# Patient Record
Sex: Male | Born: 1975 | Race: White | Hispanic: No | Marital: Married | Smoking: Never smoker
Health system: Southern US, Community
[De-identification: ages and names within clinical notes are randomized; demographics above are authoritative.]

---

## 1997-07-22 HISTORY — PX: APPENDECTOMY: SHX54

## 2013-11-20 ENCOUNTER — Encounter (HOSPITAL_COMMUNITY): Payer: Self-pay | Admitting: Emergency Medicine

## 2013-11-20 ENCOUNTER — Emergency Department (HOSPITAL_COMMUNITY): Payer: No Typology Code available for payment source

## 2013-11-20 ENCOUNTER — Emergency Department (HOSPITAL_COMMUNITY)
Admission: EM | Admit: 2013-11-20 | Discharge: 2013-11-20 | Disposition: A | Discharge status: Home / Self Care | Payer: No Typology Code available for payment source | Attending: Emergency Medicine | Admitting: Emergency Medicine

## 2013-11-20 DIAGNOSIS — S0083XA Contusion of other part of head, initial encounter: Secondary | ICD-10-CM | POA: Insufficient documentation

## 2013-11-20 DIAGNOSIS — S0003XA Contusion of scalp, initial encounter: Secondary | ICD-10-CM | POA: Insufficient documentation

## 2013-11-20 DIAGNOSIS — F17200 Nicotine dependence, unspecified, uncomplicated: Secondary | ICD-10-CM | POA: Insufficient documentation

## 2013-11-20 DIAGNOSIS — S060XAA Concussion with loss of consciousness status unknown, initial encounter: Secondary | ICD-10-CM | POA: Insufficient documentation

## 2013-11-20 DIAGNOSIS — Z23 Encounter for immunization: Secondary | ICD-10-CM | POA: Insufficient documentation

## 2013-11-20 DIAGNOSIS — S1093XA Contusion of unspecified part of neck, initial encounter: Secondary | ICD-10-CM

## 2013-11-20 DIAGNOSIS — Y92410 Unspecified street and highway as the place of occurrence of the external cause: Secondary | ICD-10-CM | POA: Insufficient documentation

## 2013-11-20 DIAGNOSIS — Y9389 Activity, other specified: Secondary | ICD-10-CM | POA: Insufficient documentation

## 2013-11-20 DIAGNOSIS — IMO0002 Reserved for concepts with insufficient information to code with codable children: Secondary | ICD-10-CM | POA: Insufficient documentation

## 2013-11-20 DIAGNOSIS — S0181XA Laceration without foreign body of other part of head, initial encounter: Secondary | ICD-10-CM

## 2013-11-20 DIAGNOSIS — S060X9A Concussion with loss of consciousness of unspecified duration, initial encounter: Secondary | ICD-10-CM | POA: Insufficient documentation

## 2013-11-20 DIAGNOSIS — S0180XA Unspecified open wound of other part of head, initial encounter: Secondary | ICD-10-CM | POA: Insufficient documentation

## 2013-11-20 MED ORDER — ONDANSETRON HCL 4 MG PO TABS: 4.0000 mg | ORAL_TABLET | Freq: Four times a day (QID) | ORAL | Status: DC

## 2013-11-20 MED ORDER — HYDROMORPHONE HCL PF 1 MG/ML IJ SOLN
1.0000 mg | Freq: Once | INTRAMUSCULAR | Status: AC
  Administered 2013-11-20: 1 mg via INTRAVENOUS
  Filled 2013-11-20: qty 1

## 2013-11-20 MED ORDER — OXYCODONE-ACETAMINOPHEN 5-325 MG PO TABS: 1.0000 | ORAL_TABLET | Freq: Four times a day (QID) | ORAL | Status: DC | PRN

## 2013-11-20 MED ORDER — TETANUS-DIPHTH-ACELL PERTUSSIS 5-2.5-18.5 LF-MCG/0.5 IM SUSP
0.5000 mL | Freq: Once | INTRAMUSCULAR | Status: AC
  Administered 2013-11-20: 0.5 mL via INTRAMUSCULAR
  Filled 2013-11-20: qty 0.5

## 2013-11-20 MED ORDER — ONDANSETRON HCL 4 MG/2ML IJ SOLN
4.0000 mg | Freq: Once | INTRAMUSCULAR | Status: AC
  Administered 2013-11-20: 4 mg via INTRAVENOUS
  Filled 2013-11-20: qty 2

## 2013-11-20 MED ORDER — MORPHINE SULFATE 4 MG/ML IJ SOLN
4.0000 mg | Freq: Once | INTRAMUSCULAR | Status: AC
  Administered 2013-11-20: 4 mg via INTRAVENOUS
  Filled 2013-11-20: qty 1
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
## 2013-11-20 NOTE — ED Notes (Signed)
Dermabond at bedside.   Gingerale and food/crackers placed at bedside.  Pt okay to eat when his pain improves. Pt did take sips of gingerale.

## 2013-11-20 NOTE — ED Notes (Signed)
Pt transported to ct scan

## 2013-11-20 NOTE — ED Notes (Signed)
Family at bedside. 
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
## 2013-11-20 NOTE — ED Notes (Signed)
Dr. Anitra LauthPlunkett at the bedside. Pt removed from the backboard.
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
## 2013-11-20 NOTE — ED Notes (Signed)
Returned from radiology.  Pt reports pain medication not helping much.

## 2014-01-03 ENCOUNTER — Ambulatory Visit: Payer: Self-pay

## 2016-07-11 ENCOUNTER — Ambulatory Visit: Payer: Self-pay | Admitting: Internal Medicine

## 2016-07-17 ENCOUNTER — Ambulatory Visit (INDEPENDENT_AMBULATORY_CARE_PROVIDER_SITE_OTHER): Payer: BLUE CROSS/BLUE SHIELD | Admitting: Internal Medicine

## 2016-07-17 ENCOUNTER — Encounter: Payer: Self-pay | Admitting: Internal Medicine

## 2016-07-17 VITALS — BP 134/98 | HR 82 | Temp 98.8°F | Ht 70.0 in | Wt 218.5 lb

## 2016-07-17 DIAGNOSIS — L2082 Flexural eczema: Secondary | ICD-10-CM

## 2016-07-17 DIAGNOSIS — R3989 Other symptoms and signs involving the genitourinary system: Secondary | ICD-10-CM | POA: Diagnosis not present

## 2016-07-17 DIAGNOSIS — B350 Tinea barbae and tinea capitis: Secondary | ICD-10-CM

## 2016-07-17 DIAGNOSIS — Z0001 Encounter for general adult medical examination with abnormal findings: Secondary | ICD-10-CM | POA: Diagnosis not present

## 2016-07-17 MED ORDER — TRIAMCINOLONE ACETONIDE 0.5 % EX OINT: 1.0000 "application " | TOPICAL_OINTMENT | Freq: Two times a day (BID) | CUTANEOUS | 0 refills | Status: DC

## 2016-07-17 NOTE — Patient Instructions (Signed)

## 2016-07-17 NOTE — Progress Notes (Signed)
HPI  Pt presents to the clinic today to establish care. He has not had a PCP in many years.  Elevated blood pressure: His BP today is 134/98. He has never been told that he has HTN before. He denies headache, blurred vision, dizziness, chest pain or shortness of breath.  Flu: 04/2016 Tetanus: 11/2013 Vision Screening: as needed Dentist: as needed   Diet: He does eat meat. He consumes fruits and veggies some days. He does eat fried foods. He drinks mostly coffee, soda and energy drinks. Exercise: None  No past medical history on file.  No current outpatient prescriptions on file.   No current facility-administered medications for this visit.     No Known Allergies  Family History  Problem Relation Age of Onset  . Hyperlipidemia Maternal Grandmother   . Diabetes Maternal Grandmother   . Heart disease Maternal Grandfather     Social History   Social History  . Marital status: Single    Spouse name: N/A  . Number of children: N/A  . Years of education: N/A   Occupational History  . Not on file.   Social History Main Topics  . Smoking status: Never Smoker  . Smokeless tobacco: Current User    Types: Snuff  . Alcohol use Yes     Comment: social  . Drug use: No  . Sexual activity: Not on file   Other Topics Concern  . Not on file   Social History Narrative  . No narrative on file    ROS:  Constitutional: Denies fever, malaise, fatigue, headache or abrupt weight changes.  HEENT: Denies eye pain, eye redness, ear pain, ringing in the ears, wax buildup, runny nose, nasal congestion, bloody nose, or sore throat. Respiratory: Denies difficulty breathing, shortness of breath, cough or sputum production.   Cardiovascular: Denies chest pain, chest tightness, palpitations or swelling in the hands or feet.  Gastrointestinal: Denies abdominal pain, bloating, constipation, diarrhea or blood in the stool.  GU: Pt reports bladder pressure. Denies frequency, urgency, pain  with urination, blood in urine, odor or discharge. Musculoskeletal: Denies decrease in range of motion, difficulty with gait, muscle pain or joint pain and swelling.  Skin: Pt reports rash above buttocks and rash of left side of scalp. Denies redness, or ulcercations.  Neurological: Denies dizziness, difficulty with memory, difficulty with speech or problems with balance and coordination.  Psych: Denies anxiety, depression, SI/HI.  No other specific complaints in a complete review of systems (except as listed in HPI above).  PE:  BP (!) 134/98   Pulse 82   Temp 98.8 F (37.1 C) (Oral)   Ht 5\' 10"  (1.778 m)   Wt 218 lb 8 oz (99.1 kg)   SpO2 98%   BMI 31.35 kg/m   Wt Readings from Last 3 Encounters:  07/17/16 218 lb 8 oz (99.1 kg)    General: Appears his stated age,  in NAD. Skin: 2 cm round lesion with central clearing on left posterior scalp. Patch of scaly skin noted at top of bilateral buttocks. HEENT: Head: normal shape and size; Eyes: sclera white, no icterus, conjunctiva pink, PERRLA and EOMs intact; Ears: Tm's gray and intact, normal light reflex; Throat/Mouth: Teeth present, mucosa pink and moist, no lesions or ulcerations noted. He has blackening of his lower front gums, tooth appears to be dead. Neck: Neck supple, trachea midline. No masses, lumps or thyromegaly present.  Cardiovascular: Normal rate and rhythm. S1,S2 noted.  No murmur, rubs or gallops noted. No JVD  or BLE edema. No carotid bruits noted. Pulmonary/Chest: Normal effort and positive vesicular breath sounds. No respiratory distress. No wheezes, rales or ronchi noted.  Abdomen: Soft and nontender. Normal bowel sounds. No distention or masses noted. Liver, spleen and kidneys non palpable. Musculoskeletal: Normal range of motion. Strength 5/5 BUE/BLE. No difficulty with gait.  Neurological: Alert and oriented. Cranial nerves II-XII grossly intact. Coordination normal.  Psychiatric: Mood and affect normal. Behavior  is normal. Judgment and thought content normal.    Assessment and Plan:  Preventative Health Maintenance:  Flu and tetanus UTD Encouraged him to consume a balanced diet and exercise regimen Advised him to see an eye doctor and dentist annually Will check CBC, CMET, Lipid and A1C today  Eczema of buttock:  Advised him to avoid hot showers or bath Encouraged him to use [facility] or Eucerin BID eRx for Kenalog cream to affected area BID  Tinea Capitis:  CMET today If liver enzymes normal, will treat with Terbinafine x 6 weeks  Bladder pressure:  Due to drinking before bed and not voiding until the next morning Advised him to stop any fluids after 7 pm  RTC in 1 year, sooner if needed Nicki ReaperBAITY, Mayanna Garlitz, NP

## 2016-07-18 ENCOUNTER — Other Ambulatory Visit: Payer: Self-pay | Admitting: Internal Medicine

## 2016-07-18 LAB — COMPREHENSIVE METABOLIC PANEL
ALT: 33 U/L (ref 0–53)
AST: 23 U/L (ref 0–37)
Albumin: 5.1 g/dL (ref 3.5–5.2)
Alkaline Phosphatase: 54 U/L (ref 39–117)
BUN: 12 mg/dL (ref 6–23)
CALCIUM: 10.1 mg/dL (ref 8.4–10.5)
CHLORIDE: 100 meq/L (ref 96–112)
CO2: 31 meq/L (ref 19–32)
CREATININE: 1 mg/dL (ref 0.40–1.50)
GFR: 87.83 mL/min (ref >60.00)
Glucose, Bld: 92 mg/dL (ref 70–99)
Potassium: 4.4 mEq/L (ref 3.5–5.1)
Sodium: 138 mEq/L (ref 135–145)
Total Bilirubin: 0.6 mg/dL (ref 0.2–1.2)
Total Protein: 8.1 g/dL (ref 6.0–8.3)

## 2016-07-18 LAB — CBC
HCT: 46 % (ref 39.0–52.0)
Hemoglobin: 15.8 g/dL (ref 13.0–17.0)
MCHC: 34.4 g/dL (ref 30.0–36.0)
MCV: 89.2 fl (ref 78.0–100.0)
PLATELETS: 264 10*3/uL (ref 150.0–400.0)
RBC: 5.16 Mil/uL (ref 4.22–5.81)
RDW: 13.1 % (ref 11.5–15.5)
WBC: 6 10*3/uL (ref 4.0–10.5)

## 2016-07-18 LAB — LIPID PANEL
Cholesterol: 223 mg/dL — ABNORMAL HIGH (ref 0–200)
HDL: 53.1 mg/dL (ref >39.00)
LDL CALC: 148 mg/dL — AB (ref 0–99)
NonHDL: 169.61
TRIGLYCERIDES: 110 mg/dL (ref 0.0–149.0)
Total CHOL/HDL Ratio: 4
VLDL: 22 mg/dL (ref 0.0–40.0)

## 2016-07-18 LAB — HEMOGLOBIN A1C: Hgb A1c MFr Bld: 4.7 % (ref 4.6–6.5)

## 2016-07-18 MED ORDER — TERBINAFINE HCL 250 MG PO TABS: 250.0000 mg | ORAL_TABLET | Freq: Every day | ORAL | 1 refills | Status: DC

## 2017-06-07 ENCOUNTER — Encounter (HOSPITAL_COMMUNITY): Payer: Self-pay | Admitting: Emergency Medicine

## 2017-06-07 ENCOUNTER — Emergency Department (HOSPITAL_COMMUNITY)
Admission: EM | Admit: 2017-06-07 | Discharge: 2017-06-07 | Disposition: A | Discharge status: Home / Self Care | Payer: BLUE CROSS/BLUE SHIELD | Attending: Emergency Medicine | Admitting: Emergency Medicine

## 2017-06-07 DIAGNOSIS — R05 Cough: Secondary | ICD-10-CM | POA: Insufficient documentation

## 2017-06-07 DIAGNOSIS — F17220 Nicotine dependence, chewing tobacco, uncomplicated: Secondary | ICD-10-CM | POA: Insufficient documentation

## 2017-06-07 DIAGNOSIS — R112 Nausea with vomiting, unspecified: Secondary | ICD-10-CM | POA: Insufficient documentation

## 2017-06-07 DIAGNOSIS — Z79899 Other long term (current) drug therapy: Secondary | ICD-10-CM | POA: Diagnosis not present

## 2017-06-07 DIAGNOSIS — J029 Acute pharyngitis, unspecified: Secondary | ICD-10-CM | POA: Diagnosis not present

## 2017-06-07 LAB — RAPID STREP SCREEN (MED CTR MEBANE ONLY): Streptococcus, Group A Screen (Direct): NEGATIVE

## 2017-06-07 MED ORDER — LIDOCAINE VISCOUS 2 % MT SOLN: 15.0000 mL | OROMUCOSAL | 0 refills | Status: DC | PRN

## 2017-06-07 MED ORDER — ONDANSETRON 4 MG PO TBDP
4.0000 mg | ORAL_TABLET | Freq: Once | ORAL | Status: AC
  Administered 2017-06-07: 4 mg via ORAL
  Filled 2017-06-07: qty 1

## 2017-06-07 MED ORDER — KETOROLAC TROMETHAMINE 60 MG/2ML IM SOLN
60.0000 mg | Freq: Once | INTRAMUSCULAR | Status: AC
  Administered 2017-06-07: 60 mg via INTRAMUSCULAR
  Filled 2017-06-07: qty 2

## 2017-06-07 MED ORDER — ONDANSETRON 4 MG PO TBDP: 4.0000 mg | ORAL_TABLET | Freq: Three times a day (TID) | ORAL | 0 refills | Status: DC | PRN

## 2017-06-07 MED ORDER — DEXAMETHASONE SODIUM PHOSPHATE 10 MG/ML IJ SOLN
10.0000 mg | Freq: Once | INTRAMUSCULAR | Status: AC
  Administered 2017-06-07: 10 mg via INTRAMUSCULAR
  Filled 2017-06-07: qty 1

## 2017-06-07 MED ORDER — LIDOCAINE VISCOUS 2 % MT SOLN
15.0000 mL | Freq: Once | OROMUCOSAL | Status: AC
  Administered 2017-06-07: 15 mL via OROMUCOSAL
  Filled 2017-06-07: qty 15

## 2017-06-07 NOTE — ED Provider Notes (Signed)
[facility] EMERGENCY DEPARTMENT Provider Note   CSN: [id number] Arrival date & time: 06/07/17  1109     History   Chief Complaint Chief Complaint  Patient presents with  . Sore Throat  . Facial Pain    HPI Ralph White is a 41 y.o. male who presents to the emergency department with a chief complaint of nasal congestion and a sore throat that began 2 days ago.  He has treated his symptoms with ibuprofen at home without relief.  He describes the pain as "razors in my throat."  He reports that he has been taking shallow breaths worsened overnight because he gets anxious about the air going in and out causing worsening pain.  He reports nausea and one episode of nonbloody nonbilious emesis this morning after he drank liquids to try and take ibuprofen and a nonproductive cough.  He denies fever, chills, headache, shortness of breath, muffled voice, chest pain, neck pain or swelling, trismus, or drooling.   No pertinent past medical history.  The history is provided by the patient. No language interpreter was used.  Sore Throat  This is a new problem. The current episode started 2 days ago. The problem occurs constantly. The problem has been gradually worsening. Pertinent negatives include no chest pain, no abdominal pain, no headaches and no shortness of breath. The symptoms are aggravated by swallowing, drinking and eating. Nothing relieves the symptoms. Treatments tried: NSAIDs. The treatment provided no relief.    History reviewed. No pertinent past medical history.  There are no active problems to display for this patient.   Past Surgical History:  Procedure Laterality Date  . APPENDECTOMY  1999       Home Medications    Prior to Admission medications   Medication Sig Start Date End Date Taking? Authorizing Provider  lidocaine (XYLOCAINE) 2 % solution Use as directed 15 mLs every 3 (three) hours as needed in the mouth or throat for mouth pain. 06/07/17    Dalayah Deahl A, PA-C  ondansetron (ZOFRAN ODT) 4 MG disintegrating tablet Take 1 tablet (4 mg total) every 8 (eight) hours as needed by mouth for nausea or vomiting. 06/07/17   Johannes Everage A, PA-C  terbinafine (LAMISIL) 250 MG tablet Take 1 tablet (250 mg total) by mouth daily. 07/18/16   Lorre Munroe, NP  triamcinolone ointment (KENALOG) 0.5 % Apply 1 application topically 2 (two) times daily. 07/17/16   Lorre Munroe, NP    Family History Family History  Problem Relation Age of Onset  . Hyperlipidemia Maternal Grandmother   . Diabetes Maternal Grandmother   . Heart disease Maternal Grandfather     Social History Social History   Tobacco Use  . Smoking status: Never Smoker  . Smokeless tobacco: Current User    Types: Snuff  Substance Use Topics  . Alcohol use: Yes    Comment: social  . Drug use: No     Allergies   Patient has no known allergies.   Review of Systems Review of Systems  Constitutional: Negative for activity change, chills and fever.  HENT: Positive for congestion, sore throat and trouble swallowing. Negative for ear pain, rhinorrhea, sinus pain, sneezing and voice change.   Eyes: Negative for visual disturbance.  Respiratory: Positive for cough. Negative for shortness of breath, wheezing and stridor.   Cardiovascular: Negative for chest pain and palpitations.  Gastrointestinal: Positive for nausea and vomiting. Negative for abdominal pain and diarrhea.  Musculoskeletal: Negative for back pain and  myalgias.  Skin: Negative for rash.  Neurological: Negative for headaches.     Physical Exam Updated Vital Signs BP 130/87 (BP Location: Right Arm)   Pulse 66   Temp 98.4 F (36.9 C) (Oral)   Resp 19   Ht 5' 10.5" (1.791 m)   Wt 97.5 kg (215 lb)   SpO2 97%   BMI 30.41 kg/m   Physical Exam  Constitutional: He appears well-developed.  Anxious appearing  HENT:  Head: Normocephalic and atraumatic.  Right Ear: Tympanic membrane and ear canal  normal.  Left Ear: Tympanic membrane and ear canal normal.  Mouth/Throat: Uvula is midline and mucous membranes are normal. No oral lesions. No trismus in the jaw. No uvula swelling or dental caries. Posterior oropharyngeal edema and posterior oropharyngeal erythema present. No tonsillar abscesses. Tonsils are 1+ on the right. Tonsils are 2+ on the left. No tonsillar exudate.  Eyes: Conjunctivae are normal.  Neck: Normal range of motion. Neck supple. No thyromegaly present.  Cardiovascular: Normal rate, regular rhythm and normal heart sounds. Exam reveals no gallop and no friction rub.  No murmur heard. Pulmonary/Chest: Effort normal.  Abdominal: Soft. He exhibits no distension.  Lymphadenopathy:    He has cervical adenopathy.  Neurological: He is alert.  Skin: Skin is warm and dry.  Psychiatric: His behavior is normal.  Nursing note and vitals reviewed.    ED Treatments / Results  Labs (all labs ordered are listed, but only abnormal results are displayed) Labs Reviewed  RAPID STREP SCREEN (NOT AT [facility])    EKG  EKG Interpretation None       Radiology No results found.  Procedures Procedures (including critical care time)  Medications Ordered in ED Medications  lidocaine (XYLOCAINE) 2 % viscous mouth solution 15 mL (15 mLs Mouth/Throat Given 06/07/17 1448)  ondansetron (ZOFRAN-ODT) disintegrating tablet 4 mg (4 mg Oral Given 06/07/17 1448)  ketorolac (TORADOL) injection 60 mg (60 mg Intramuscular Given 06/07/17 1451)  dexamethasone (DECADRON) injection 10 mg (10 mg Intramuscular Given 06/07/17 1451)     Initial Impression / Assessment and Plan / ED Course  I have reviewed the triage vital signs and the nursing notes.  Pertinent labs & imaging results that were available during my care of the patient were reviewed by me and considered in my medical decision making (see chart for details).  Clinical Course as of Jun 07 1530  Sat Jun 07, 2017  1523 On reevaluation,  no tachypnea.  The patient is sitting comfortably upright in the chair.  No acute distress.  He reports that his pain has significantly improved.  Will fluid challenge the patient with orange juice and plan for discharge.  [MM]    Clinical Course User Index [MM] Boluwatife Mutchler A, PA-C    41 y.o. patient with 2 days of acute pharyngitis.  On initial examination, the patient appears anxious and is taking shallow breaths because he states "air feels like razor blades in my throat."  On reevaluation, after viscous lidocaine, the patient is sitting comfortably; no tachypnea; no acute distress.  Afebrile with anterior cervical lymphadenopathy. No tonsillar exudate; mild left-sided tonsillar edema.  Doubt peritonsillar abscess. No asymmetry of the uvula or soft palate. No stridor, trismus, or "hot potato" voice. The patient appears non-toxic. NAD. VSS. Rapid strep negative.  Presentation non concerning for Ludwig's angina, Uvulitis, epiglottitis, peritonsillar abscess, or retropharyngeal abscess. Specific return precautions discussed. Pt able to drink water in ED without difficulty with intact airway. Will d/c the patient with  symptomatic treatment. Recommended PCP follow up.    Final Clinical Impressions(s) / ED Diagnoses   Final diagnoses:  Viral pharyngitis  Nausea and vomiting, intractability of vomiting not specified, unspecified vomiting type    ED Discharge Orders        Ordered    lidocaine (XYLOCAINE) 2 % solution  Every  3 hours PRN     06/07/17 1525    ondansetron (ZOFRAN ODT) 4 MG disintegrating tablet  Every 8 hours PRN     06/07/17 1526       Lavonna Lampron A, PA-C 06/07/17 1531    Gerhard MunchLockwood, Robert, MD 06/07/17 [phone]

## 2017-06-07 NOTE — ED Triage Notes (Signed)
Pt. Stated, I started having Thursday with sinus drainage and then my throat started hurting really bad. Im really hoarse.n  Pt's  left side of throat/tonsils is red.

## 2017-06-07 NOTE — ED Triage Notes (Signed)
Reported to PA Pt is requesting pain meds.

## 2017-06-07 NOTE — ED Notes (Signed)
Declined W/C at D/C and was escorted to lobby by RN. 

## 2017-06-07 NOTE — Discharge Instructions (Signed)
You can swallow 15 mL of viscous lidocaine once every 3 hours as needed for throat pain.  Please do not use this medication more than once every 3 hours.  You can take 1000 mg of Tylenol once every 8 hours or 800 mg of ibuprofen with food to help with pain and inflammation.  Drinking hot and cold liquids should cause less pain with swallowing than room temperature food or liquids.  If you develop new or worsening symptoms, including the inability to fully open your mouth, muffled voice, drooling, or feeling as if your throat is closing, please return to the emergency department for reevaluation.

## 2017-06-07 NOTE — ED Triage Notes (Signed)
Pt drinking apple juice without difficulty.

## 2017-06-09 ENCOUNTER — Telehealth: Payer: Self-pay | Admitting: Internal Medicine

## 2017-06-09 NOTE — Telephone Encounter (Signed)
Spoke to pt who states his symptoms have not improved from ED visit. Pt is wanting to know if there are any suggestions Rene KocherRegina has, so that he can return to work. pls advise

## 2017-06-09 NOTE — Telephone Encounter (Signed)
Patient advised.

## 2017-06-09 NOTE — Telephone Encounter (Signed)
Copied from CRM [id number]. Topic: Appointment Scheduling - Scheduling Inquiry for Clinic >> Jun 09, 2017  9:47 AM Ralph White, Ralph White wrote: Reason for CRM: pt went to hospital on Sat and is progressively getting worse.  Pt declined another practice, stating it is too far and Dr Sampson SiBaity is his PCP. All OTC not working.  Pt was given gargle med at the hospital. Also steroid injection. But not better. Requesting appt today  Please call wife. Pt has no voice, she has him sitting in hot shower right now.  Attempted to call patient- mailbox full- could not leave message.

## 2017-06-09 NOTE — Telephone Encounter (Signed)
Its hard for me to give advice, when I haven't seen him for this. The steroid injection should help. Otherwise, Ibuprofen, antihistamine and salt water gargles.

## 2017-06-10 LAB — CULTURE, GROUP A STREP (THRC)

## 2017-07-18 ENCOUNTER — Encounter: Payer: Self-pay | Admitting: Internal Medicine

## 2017-07-18 ENCOUNTER — Ambulatory Visit (INDEPENDENT_AMBULATORY_CARE_PROVIDER_SITE_OTHER): Payer: BLUE CROSS/BLUE SHIELD | Admitting: Internal Medicine

## 2017-07-18 VITALS — BP 128/82 | HR 91 | Temp 98.3°F | Ht 70.25 in | Wt 223.0 lb

## 2017-07-18 DIAGNOSIS — L989 Disorder of the skin and subcutaneous tissue, unspecified: Secondary | ICD-10-CM

## 2017-07-18 DIAGNOSIS — Z13220 Encounter for screening for lipoid disorders: Secondary | ICD-10-CM

## 2017-07-18 DIAGNOSIS — Z0001 Encounter for general adult medical examination with abnormal findings: Secondary | ICD-10-CM

## 2017-07-18 LAB — COMPREHENSIVE METABOLIC PANEL
ALBUMIN: 5 g/dL (ref 3.5–5.2)
ALK PHOS: 51 U/L (ref 39–117)
ALT: 48 U/L (ref 0–53)
AST: 31 U/L (ref 0–37)
BILIRUBIN TOTAL: 1 mg/dL (ref 0.2–1.2)
BUN: 9 mg/dL (ref 6–23)
CALCIUM: 9.8 mg/dL (ref 8.4–10.5)
CO2: 30 mEq/L (ref 19–32)
Chloride: 102 mEq/L (ref 96–112)
Creatinine, Ser: 1.01 mg/dL (ref 0.40–1.50)
GFR: 86.4 mL/min (ref >60.00)
Glucose, Bld: 97 mg/dL (ref 70–99)
Potassium: 4.2 mEq/L (ref 3.5–5.1)
Sodium: 139 mEq/L (ref 135–145)
TOTAL PROTEIN: 7.9 g/dL (ref 6.0–8.3)

## 2017-07-18 LAB — CBC
HCT: 45.2 % (ref 39.0–52.0)
HEMOGLOBIN: 15.6 g/dL (ref 13.0–17.0)
MCHC: 34.6 g/dL (ref 30.0–36.0)
MCV: 90.8 fl (ref 78.0–100.0)
Platelets: 267 10*3/uL (ref 150.0–400.0)
RBC: 4.98 Mil/uL (ref 4.22–5.81)
RDW: 13.4 % (ref 11.5–15.5)
WBC: 5.4 10*3/uL (ref 4.0–10.5)

## 2017-07-18 LAB — LIPID PANEL
CHOLESTEROL: 224 mg/dL — AB (ref 0–200)
HDL: 48.8 mg/dL (ref >39.00)
LDL Cholesterol: 155 mg/dL — ABNORMAL HIGH (ref 0–99)
NonHDL: 175.11
TRIGLYCERIDES: 103 mg/dL (ref 0.0–149.0)
Total CHOL/HDL Ratio: 5
VLDL: 20.6 mg/dL (ref 0.0–40.0)

## 2017-07-18 NOTE — Progress Notes (Signed)
Subjective:    Patient ID: Ralph White, male    DOB: August 05, 1975, 41 y.o.   MRN: [id number]  HPI  Pt presents to the clinic today for his annual exam. Pt reports persistent skin lesion of scalp. He was treated for this 1 year ago with Terbinafine for a tinea capitis. He reports no improvement since that time. The lesion has not gotten any bigger is size. He denies any drainage from the area. He has not tried anything OTC for his symptoms.  Flu: 04/2017 Tetanus: 11/2013 Vision Screening: as needed Dentist: as needed  Diet: He does eat meat. He consumes some fruits and veggies. He does eat fried foods. He drinks mostly coffee, Monster energy drinks and Coke. Exercise: None  Review of Systems  No past medical history on file.  Current Outpatient Medications  Medication Sig Dispense Refill  . lidocaine (XYLOCAINE) 2 % solution Use as directed 15 mLs every 3 (three) hours as needed in the mouth or throat for mouth pain. 100 mL 0  . ondansetron (ZOFRAN ODT) 4 MG disintegrating tablet Take 1 tablet (4 mg total) every 8 (eight) hours as needed by mouth for nausea or vomiting. 20 tablet 0  . terbinafine (LAMISIL) 250 MG tablet Take 1 tablet (250 mg total) by mouth daily. 30 tablet 1  . triamcinolone ointment (KENALOG) 0.5 % Apply 1 application topically 2 (two) times daily. 30 g 0   No current facility-administered medications for this visit.     No Known Allergies  Family History  Problem Relation Age of Onset  . Hyperlipidemia Maternal Grandmother   . Diabetes Maternal Grandmother   . Heart disease Maternal Grandfather     Social History   Socioeconomic History  . Marital status: Single    Spouse name: Not on file  . Number of children: Not on file  . Years of education: Not on file  . Highest education level: Not on file  Social Needs  . Financial resource strain: Not on file  . Food insecurity - worry: Not on file  . Food insecurity - inability: Not on file  .  Transportation needs - medical: Not on file  . Transportation needs - non-medical: Not on file  Occupational History  . Not on file  Tobacco Use  . Smoking status: Never Smoker  . Smokeless tobacco: Current User    Types: Snuff  Substance and Sexual Activity  . Alcohol use: Yes    Comment: social  . Drug use: No  . Sexual activity: Yes  Other Topics Concern  . Not on file  Social History Narrative  . Not on file     Constitutional: Denies fever, malaise, fatigue, headache or abrupt weight changes.  HEENT: Denies eye pain, eye redness, ear pain, ringing in the ears, wax buildup, runny nose, nasal congestion, bloody nose, or sore throat. Respiratory: Denies difficulty breathing, shortness of breath, cough or sputum production.   Cardiovascular: Denies chest pain, chest tightness, palpitations or swelling in the hands or feet.  Gastrointestinal: Denies abdominal pain, bloating, constipation, diarrhea or blood in the stool.  GU: Pt reports weak urine stream. Denies urgency, frequency, pain with urination, burning sensation, blood in urine, odor or discharge. Musculoskeletal: Denies decrease in range of motion, difficulty with gait, muscle pain or joint pain and swelling.  Skin: Pt reports skin lesion of scalp. Denies  ulcercations.  Neurological: Denies dizziness, difficulty with memory, difficulty with speech or problems with balance and coordination.  Psych: Denies anxiety, depression,  SI/HI.  No other specific complaints in a complete review of systems (except as listed in HPI above).     Objective:   Physical Exam  BP 128/82   Pulse 91   Temp 98.3 F (36.8 C) (Oral)   Ht 5' 10.25" (1.784 m)   Wt 223 lb (101.2 kg)   SpO2 98%   BMI 31.77 kg/m  Wt Readings from Last 3 Encounters:  07/18/17 223 lb (101.2 kg)  06/07/17 215 lb (97.5 kg)  07/17/16 218 lb 8 oz (99.1 kg)    General: Appears his stated age, obese in NAD. Skin: Warm, dry and intact. 1.5 cm erythematous,  scaly, round lesion noted of left occipital region. HEENT: Head: normal shape and size; Eyes: sclera white, no icterus, conjunctiva pink, PERRLA and EOMs intact; Ears: Tm's gray and intact, normal light reflex; Throat/Mouth: Teeth present, mucosa pink and moist, no exudate, lesions or ulcerations noted.  Neck:  Neck supple, trachea midline. No masses, lumps or thyromegaly present.  Cardiovascular: Normal rate and rhythm. S1,S2 noted.  No murmur, rubs or gallops noted. No JVD or BLE edema.  Pulmonary/Chest: Normal effort and positive vesicular breath sounds. No respiratory distress. No wheezes, rales or ronchi noted.  Abdomen: Soft and nontender. Normal bowel sounds. No distention or masses noted. Liver, spleen and kidneys non palpable. Musculoskeletal: Strength 5/5 BUE/BLE. No difficulty with gait.  Neurological: Alert and oriented. Cranial nerves II-XII grossly intact. Coordination normal.  Psychiatric: Mood and affect normal. Behavior is normal. Judgment and thought content normal.    BMET    Component Value Date/Time   NA 138 07/17/2016 1540   K 4.4 07/17/2016 1540   CL 100 07/17/2016 1540   CO2 31 07/17/2016 1540   GLUCOSE 92 07/17/2016 1540   BUN 12 07/17/2016 1540   CREATININE 1.00 07/17/2016 1540   CALCIUM 10.1 07/17/2016 1540    Lipid Panel     Component Value Date/Time   CHOL 223 (H) 07/17/2016 1540   TRIG 110.0 07/17/2016 1540   HDL 53.10 07/17/2016 1540   CHOLHDL 4 07/17/2016 1540   VLDL 22.0 07/17/2016 1540   LDLCALC 148 (H) 07/17/2016 1540    CBC    Component Value Date/Time   WBC 6.0 07/17/2016 1540   RBC 5.16 07/17/2016 1540   HGB 15.8 07/17/2016 1540   HCT 46.0 07/17/2016 1540   PLT 264.0 07/17/2016 1540   MCV 89.2 07/17/2016 1540   MCHC 34.4 07/17/2016 1540   RDW 13.1 07/17/2016 1540    Hgb A1C Lab Results  Component Value Date   HGBA1C 4.7 07/17/2016            Assessment & Plan:   Preventative Health Maintenance:  Flu and tetanus  UTD Encouraged him to consume a balanced diet and exercise regimen Advised him to see an eye doctor and dentist annually Will check CBC, CMET and Lipid profile today  Skin Lesion of Scalp:  Failed antifungal treatment for tinea capitis Will refer to dermatology for further evaluation  RTC in 1 year, sooner if needed Nicki ReaperBAITY, Amier Hoyt, NP

## 2017-07-18 NOTE — Patient Instructions (Signed)

## 2017-07-25 MED ORDER — ATORVASTATIN CALCIUM 10 MG PO TABS: 10.0000 mg | ORAL_TABLET | Freq: Every day | ORAL | 2 refills | Status: DC

## 2017-07-25 NOTE — Addendum Note (Signed)
Addended by: Desmond DikeKNIGHT, Bryanah Sidell H on: 07/25/2017 10:26 AM   Modules accepted: Orders

## 2017-07-25 NOTE — Addendum Note (Signed)
Addended by: Desmond DikeKNIGHT, Carrick Rijos H on: 07/25/2017 10:25 AM   Modules accepted: Orders

## 2017-08-26 ENCOUNTER — Other Ambulatory Visit: Payer: Self-pay

## 2017-08-26 ENCOUNTER — Encounter: Payer: Self-pay | Admitting: Emergency Medicine

## 2017-08-26 ENCOUNTER — Emergency Department
Admission: EM | Admit: 2017-08-26 | Discharge: 2017-08-26 | Disposition: A | Discharge status: Home / Self Care | Payer: BLUE CROSS/BLUE SHIELD | Attending: Emergency Medicine | Admitting: Emergency Medicine

## 2017-08-26 ENCOUNTER — Emergency Department: Payer: BLUE CROSS/BLUE SHIELD

## 2017-08-26 DIAGNOSIS — Z79899 Other long term (current) drug therapy: Secondary | ICD-10-CM | POA: Diagnosis not present

## 2017-08-26 DIAGNOSIS — N50812 Left testicular pain: Secondary | ICD-10-CM

## 2017-08-26 DIAGNOSIS — Z9049 Acquired absence of other specified parts of digestive tract: Secondary | ICD-10-CM | POA: Insufficient documentation

## 2017-08-26 DIAGNOSIS — K4090 Unilateral inguinal hernia, without obstruction or gangrene, not specified as recurrent: Secondary | ICD-10-CM | POA: Insufficient documentation

## 2017-08-26 DIAGNOSIS — N50819 Testicular pain, unspecified: Secondary | ICD-10-CM | POA: Diagnosis present

## 2017-08-26 LAB — URINALYSIS, COMPLETE (UACMP) WITH MICROSCOPIC
BACTERIA UA: NONE SEEN
BILIRUBIN URINE: NEGATIVE
Glucose, UA: NEGATIVE mg/dL
Hgb urine dipstick: NEGATIVE
Ketones, ur: NEGATIVE mg/dL
LEUKOCYTES UA: NEGATIVE
NITRITE: NEGATIVE
Protein, ur: NEGATIVE mg/dL
SQUAMOUS EPITHELIAL / LPF: NONE SEEN
Specific Gravity, Urine: 1.024 (ref 1.005–1.030)
pH: 5 (ref 5.0–8.0)

## 2017-08-26 MED ORDER — KETOROLAC TROMETHAMINE 30 MG/ML IJ SOLN
30.0000 mg | Freq: Once | INTRAMUSCULAR | Status: AC
  Administered 2017-08-26: 30 mg via INTRAMUSCULAR
  Filled 2017-08-26: qty 1

## 2017-08-26 MED ORDER — NAPROXEN 500 MG PO TABS: 500.0000 mg | ORAL_TABLET | Freq: Two times a day (BID) | ORAL | 2 refills | Status: DC

## 2017-08-26 NOTE — ED Provider Notes (Signed)
[facility] Emergency Department Provider Note   ____________________________________________    I have reviewed the triage vital signs and the nursing notes.   HISTORY  Chief Complaint Testicle Pain     HPI Ralph White is a 42 y.o. male who presents with complaints of left testicle pain.  Pain started yesterday he describes as a pinching pain occasionally gets worse sometimes it completely goes away.  Has never had this before.  Denies dysuria.  No penile discharge.  No history of kidney stones.  No flank pain.  No nausea or vomiting or fevers or chills.  No erythema or testicular swelling   History reviewed. No pertinent past medical history.  There are no active problems to display for this patient.   Past Surgical History:  Procedure Laterality Date  . APPENDECTOMY  1999    Prior to Admission medications   Medication Sig Start Date End Date Taking? Authorizing Provider  atorvastatin (LIPITOR) 10 MG tablet Take 1 tablet (10 mg total) by mouth daily. 07/25/17  Yes Lorre MunroeBaity, Regina W, NP  naproxen (NAPROSYN) 500 MG tablet Take 1 tablet (500 mg total) by mouth 2 (two) times daily with a meal. 08/26/17   Jene EveryKinner, Tifani Dack, MD     Allergies Patient has no known allergies.  Family History  Problem Relation Age of Onset  . Hyperlipidemia Maternal Grandmother   . Diabetes Maternal Grandmother   . Heart disease Maternal Grandfather     Social History Social History   Tobacco Use  . Smoking status: Never Smoker  . Smokeless tobacco: Current User    Types: Snuff  Substance Use Topics  . Alcohol use: Yes    Comment: social  . Drug use: No    Review of Systems  Constitutional: No fever/chills Eyes: No visual changes.  ENT: No sore throat. Cardiovascular: Denies chest pain. Respiratory: Denies shortness of breath. Gastrointestinal: No abdominal pain.  No nausea, no vomiting.   Genitourinary: As above Musculoskeletal: Negative for back  pain. Skin: Negative for rash. Neurological: Negative for headaches    ____________________________________________   PHYSICAL EXAM:  VITAL SIGNS: ED Triage Vitals  Enc Vitals Group     BP 08/26/17 0632 127/88     Pulse Rate 08/26/17 0632 88     Resp 08/26/17 0632 18     Temp 08/26/17 0632 98.8 F (37.1 C)     Temp Source 08/26/17 0632 Oral     SpO2 08/26/17 0632 97 %     Weight 08/26/17 0633 99.8 kg (220 lb)     Height 08/26/17 0633 1.778 m (5\' 10" )     Head Circumference --      Peak Flow --      Pain Score 08/26/17 0633 3     Pain Loc --      Pain Edu? --      Excl. in GC? --     Constitutional: Alert and oriented. No acute distress. Pleasant and interactive Eyes: Conjunctivae are normal.   Nose: No congestion/rhinnorhea. Mouth/Throat: Mucous membranes are moist.    Cardiovascular: Normal rate, regular rhythm.   Good peripheral circulation. Respiratory: Normal respiratory effort.  No retractions. Gastrointestinal: Soft and nontender. No distention.  No CVA tenderness. Genitourinary: Normal scrotum, no erythema, no swelling, no penile discharge, no rash, no hernias felt Musculoskeletal:   Warm and well perfused Neurologic:  Normal speech and language. No gross focal neurologic deficits are appreciated.  Skin:  Skin is warm, dry and intact. No rash  noted. Psychiatric: Mood and affect are normal. Speech and behavior are normal.  ____________________________________________   LABS (all labs ordered are listed, but only abnormal results are displayed)  Labs Reviewed  URINALYSIS, COMPLETE (UACMP) WITH MICROSCOPIC - Abnormal; Notable for the following components:      Result Value   Color, Urine YELLOW (*)    APPearance HAZY (*)    All other components within normal limits   ____________________________________________  EKG  None ____________________________________________  RADIOLOGY  Ultrasound scrotum unremarkable CT renal stone study shows small  inguinal hernias ____________________________________________   PROCEDURES  Procedure(s) performed: No  Procedures   Critical Care performed: No ____________________________________________   INITIAL IMPRESSION / ASSESSMENT AND PLAN / ED COURSE  Pertinent labs & imaging results that were available during my care of the patient were reviewed by me and considered in my medical decision making (see chart for details).  Patient well-appearing in no acute distress.  Exam is quite reassuring.  Ultrasound scrotum normal.  CT scan ordered for possible kidney stone causing radiated pain in the testicle was also unremarkable, shows very small inguinal hernias.  I will have the patient follow-up with urology, he has had significant relief from Toradol so we will discharge with Naprosyn    ____________________________________________   FINAL CLINICAL IMPRESSION(S) / ED DIAGNOSES  Final diagnoses:  Testicular pain, left        Note:  This document was prepared using Dragon voice recognition software and may include unintentional dictation errors.    Jene Every, MD 08/26/17 1356

## 2017-08-26 NOTE — ED Triage Notes (Addendum)
Patient ambulatory to triage with steady gait, without difficulty or distress noted; pt reports since yesterday having pain to left testicle; denies any swelling; denies any other accomp symptoms

## 2017-08-26 NOTE — ED Notes (Signed)
Signature pad not working in pt room.  Pt verbalized understanding of d/c instructions and use of RX.

## 2017-08-26 NOTE — ED Notes (Addendum)
PT c/o LFT testicle pain since yesterday, denies swelling or redness, no injury known. MD at bedside

## 2017-09-16 ENCOUNTER — Ambulatory Visit (INDEPENDENT_AMBULATORY_CARE_PROVIDER_SITE_OTHER): Payer: BLUE CROSS/BLUE SHIELD | Admitting: Urology

## 2017-09-16 ENCOUNTER — Encounter: Payer: Self-pay | Admitting: Urology

## 2017-09-16 VITALS — BP 133/81 | HR 87 | Ht 70.0 in | Wt 215.0 lb

## 2017-09-16 DIAGNOSIS — Z125 Encounter for screening for malignant neoplasm of prostate: Secondary | ICD-10-CM | POA: Diagnosis not present

## 2017-09-16 DIAGNOSIS — R39198 Other difficulties with micturition: Secondary | ICD-10-CM | POA: Diagnosis not present

## 2017-09-16 DIAGNOSIS — N50819 Testicular pain, unspecified: Secondary | ICD-10-CM

## 2017-09-16 NOTE — Progress Notes (Signed)
09/16/2017 9:08 AM   Ralph HanlonJoshua White 28-Aug-1975 [id number]  Referring provider: Lorre MunroeBaity, Regina W, NP [address] [place], [place] [id number]  Chief Complaint  Patient presents with  . Testicle Pain    New Patient    HPI: 42 year old male who presents today for follow-up from an emergency room visit on 08/26/2017 with acute left testicular pain.    He reports that on the day prior of his ER visit, he began experiencing dull aching left testicular pain which worsened throughout the day.  He also had some pain in his left belt line.  The pain continued throughout the evening.  In the early morning hours, he woke up his wife with severe pain which ultimately led to an ER visit.  In the ER, he underwent further evaluation with us renal ultrasound which was fairly unremarkable other than incidental 4 mm right epididymal cyst.  He also had a noncontrast CT scan to evaluate for stones which showed no stones or hydronephrosis.  Incidental bilateral small fat-containing inguinal hernias appreciated.  Within several hours of discharge from the ER, his pain completely resolved.  He did receive NSAIDs in the ER and NSAIDs upon discharge.  He has not had any recurrence of his pain.  Denies any previous history of testicular or flank pain.  He denies personal history of kidney stones.  He does have some baseline urinary issues including weaker urinary stream which is progressive over the past few years.  He also reports in the morning time when he asked to urinate, he has discomfort in his abdomen and his testicles.  This resolved with voiding.  Denies any dysuria, gross hematuria, UTIs, or any other voiding symptoms.  No family history of prostate cancer.   PMH: No past medical history on file.  Surgical History: Past Surgical History:  Procedure Laterality Date  . APPENDECTOMY  1999    Home Medications:  Allergies as of 09/16/2017   No Known Allergies     Medication List    as of  09/16/2017  9:08 AM   You have not been prescribed any medications.     Allergies: No Known Allergies  Family History: Family History  Problem Relation Age of Onset  . Hyperlipidemia Maternal Grandmother   . Diabetes Maternal Grandmother   . Heart disease Maternal Grandfather     Social History:  reports that  has never smoked. His smokeless tobacco use includes snuff. He reports that he drinks alcohol. He reports that he does not use drugs.  ROS: UROLOGY Frequent Urination?: No Hard to postpone urination?: No Burning/pain with urination?: No Get up at night to urinate?: Yes Leakage of urine?: No Urine stream starts and stops?: No Trouble starting stream?: No Do you have to strain to urinate?: No Blood in urine?: No Urinary tract infection?: No Sexually transmitted disease?: No Injury to kidneys or bladder?: No Painful intercourse?: No Weak stream?: Yes Erection problems?: No Penile pain?: No  Gastrointestinal Nausea?: No Vomiting?: No Indigestion/heartburn?: No Diarrhea?: No Constipation?: No  Constitutional Fever: No Night sweats?: No Weight loss?: No Fatigue?: No  Skin Skin rash/lesions?: No Itching?: No  Eyes Blurred vision?: No Double vision?: No  Ears/Nose/Throat Sore throat?: No Sinus problems?: No  Hematologic/Lymphatic Swollen glands?: No Easy bruising?: No  Cardiovascular Leg swelling?: No Chest pain?: No  Respiratory Cough?: No Shortness of breath?: No  Endocrine Excessive thirst?: No  Musculoskeletal Back pain?: No Joint pain?: No  Neurological Headaches?: No Dizziness?: No  Psychologic Depression?:  No Anxiety?: No  Physical Exam: BP 133/81   Pulse 87   Ht 5\' 10"  (1.778 m)   Wt 215 lb (97.5 kg)   BMI 30.85 kg/m   Constitutional:  Alert and oriented, No acute distress. HEENT: [place] AT, moist mucus membranes.  Trachea midline, no masses. Cardiovascular: No clubbing, cyanosis, or edema. Respiratory: Normal  respiratory effort, no increased work of breathing. GI: Abdomen is soft, nontender, nondistended, no abdominal masses GU: Circumcised phallus with bilateral descended testicles, nontender, no masses.  Urethral meatus patent.  Inguinal hernias not readily appreciated on exam today. Rectal: Normal sphincter tone, 40 cc prostate, nontender, no nodules. Skin: No rashes, bruises or suspicious lesions. Neurologic: Grossly intact, no focal deficits, moving all 4 extremities. Psychiatric: Normal mood and affect.  Laboratory Data: Lab Results  Component Value Date   WBC 5.4 07/18/2017   HGB 15.6 07/18/2017   HCT 45.2 07/18/2017   MCV 90.8 07/18/2017   PLT 267.0 07/18/2017    Lab Results  Component Value Date   CREATININE 1.01 07/18/2017     Lab Results  Component Value Date   HGBA1C 4.7 07/17/2016    Urinalysis Lab Results  Component Value Date   APPEARANCEUR HAZY (A) 08/26/2017   LEUKOCYTESUR NEGATIVE 08/26/2017   PROTEINUR NEGATIVE 08/26/2017   GLUCOSEU NEGATIVE 08/26/2017   RBCU 0-5 08/26/2017   BILIRUBINUR NEGATIVE 08/26/2017   NITRITE NEGATIVE 08/26/2017    Lab Results  Component Value Date   BACTERIA NONE SEEN 08/26/2017    Pertinent Imaging: Results for orders placed during the hospital encounter of 08/26/17  [place] SCROTOM DOPPLER   Narrative CLINICAL DATA:  Left testicular pain.  Symptoms began yesterday.  EXAM: SCROTAL ULTRASOUND  DOPPLER ULTRASOUND OF THE TESTICLES  TECHNIQUE: Complete ultrasound examination of the testicles, epididymis, and other scrotal structures was performed. Color and spectral Doppler ultrasound were also utilized to evaluate blood flow to the testicles.  COMPARISON:  None.  FINDINGS: Right testicle  Measurements: 4.0 x 2.1 x 2.9 cm, within normal limits. No mass or microlithiasis visualized.  Left testicle  Measurements: 4.1 x 2.0 x 2.9 cm, within normal limits. No mass or microlithiasis visualized.  Right epididymis: A  benign cyst within the epididymal head measures 4 mm.  Left epididymis:  Normal in size and appearance.  Hydrocele: Minimal fluid about the right testicle is likely physiologic.  Varicocele:  None visualized.  Pulsed Doppler interrogation of both testes demonstrates normal low resistance arterial and venous waveforms bilaterally.  IMPRESSION: 1. Essentially normal scrotal ultrasound with Doppler interrogation. 2. 4 mm epididymal cyst or spermatocele on the right is unlikely to be of clinical consequence to the patient.   Electronically Signed   By: Marin Roberts M.D.   On: 08/26/2017 07:28     Results for orders placed during the hospital encounter of 08/26/17  CT RENAL STONE STUDY   Narrative CLINICAL DATA:  42 year old male with left testicular pain since yesterday. Prior appendectomy. Subsequent encounter.  EXAM: CT ABDOMEN AND PELVIS WITHOUT CONTRAST  TECHNIQUE: Multidetector CT imaging of the abdomen and pelvis was performed following the standard protocol without IV contrast.  COMPARISON:  Testicular sonogram 08/26/2017.  FINDINGS: Lower chest: Minimal basilar atelectasis/scarring. Heart size within normal limits.  Hepatobiliary: Taking into account limitation by non contrast imaging, no worrisome hepatic lesion. No calcified gallstones.  Pancreas: Taking into account limitation by non contrast imaging, no worrisome pancreatic mass or inflammation.  Spleen: Taking into account limitation by non contrast imaging, no splenic mass  or enlargement.  Adrenals/Urinary Tract: No obstructing stone or hydronephrosis. Slightly dense renal pyramids probably normal rather than representing changes of medullary sponge disease.  Taking into account limitation by non contrast imaging, no adrenal or renal worrisome mass.  Decompressed urinary bladder with circumferential wall thickening.  Stomach/Bowel: No extraluminal bowel inflammatory process or  mass identified.  Vascular/Lymphatic: No abdominal aortic aneurysm. Coarse calcification internal iliac artery bilaterally.  No adenopathy  Reproductive: No worrisome abnormality  Other: Small fat and vessel containing inguinal hernias. No bowel containing hernia. No free intraperitoneal air.  Musculoskeletal: Small Schmorl's node deformities lower thoracic spine. No osseous destructive lesion.  IMPRESSION: No obstructing stone or hydronephrosis.  No bowel inflammatory process.  Small fat and vessel containing inguinal hernias without bowel containing hernia.  Decompressed urinary bladder with circumferential wall thickening.  Focal calcification internal iliac artery bilaterally. No other evidence of atherosclerosis.   Electronically Signed   By: Lacy Duverney M.D.   On: 08/26/2017 09:07    CT abdomen pelvis as well as scrotal ultrasound was personally reviewed today.  Assessment & Plan:    1. Slowing, urinary stream Mildly enlarged prostate identified on CT scan with urinary stream is essentially only urinary symptom at this point in time Rectal exam unremarkable although enlarged for age Discussed possibility of addition of Flomax, declined at this time - PSA  2. Testicular pain Resolved History most consistent with episode of small ureteral stone, however, this is not identified on CT scan and no blood in urine Differential diagnosis also includes testicular trauma versus torsion although less likely given his age and normal scrotal ultrasound Advised to return if pain recurs  3. Screening PSA (prostate specific antigen) PSA screening today for baseline in the setting of BPH Call with results If PSA is extremely low, would recommend resuming screening at age 75-50  Return if symptoms worsen or fail to improve.  Vanna Scotland, MD  [facility] [address] [place], [place] [address] [phone]

## 2017-09-17 ENCOUNTER — Telehealth: Payer: Self-pay

## 2017-09-17 LAB — PSA: Prostate Specific Ag, Serum: 0.4 ng/mL (ref 0.0–4.0)

## 2017-09-17 NOTE — Telephone Encounter (Signed)
Letter sent.

## 2017-09-17 NOTE — Telephone Encounter (Signed)
-----   Message from Vanna ScotlandAshley Brandon, MD sent at 09/17/2017  9:42 AM EST ----- PSA is excellent.  0.4 ng/ mL.  Would not recheck until age 42 at earliest.    Vanna ScotlandAshley Brandon, MD

## 2017-10-18 ENCOUNTER — Other Ambulatory Visit: Payer: Self-pay | Admitting: Internal Medicine

## 2017-10-23 ENCOUNTER — Other Ambulatory Visit (INDEPENDENT_AMBULATORY_CARE_PROVIDER_SITE_OTHER): Payer: BLUE CROSS/BLUE SHIELD

## 2017-10-23 DIAGNOSIS — Z13220 Encounter for screening for lipoid disorders: Secondary | ICD-10-CM | POA: Diagnosis not present

## 2017-10-23 LAB — LIPID PANEL
Cholesterol: 130 mg/dL (ref 0–200)
HDL: 39.7 mg/dL (ref >39.00)
LDL Cholesterol: 59 mg/dL (ref 0–99)
NONHDL: 89.95
Total CHOL/HDL Ratio: 3
Triglycerides: 154 mg/dL — ABNORMAL HIGH (ref 0.0–149.0)
VLDL: 30.8 mg/dL (ref 0.0–40.0)

## 2017-11-16 ENCOUNTER — Other Ambulatory Visit: Payer: Self-pay | Admitting: Internal Medicine

## 2018-05-26 ENCOUNTER — Other Ambulatory Visit: Payer: Self-pay | Admitting: Internal Medicine

## 2018-06-23 ENCOUNTER — Other Ambulatory Visit: Payer: Self-pay | Admitting: Internal Medicine

## 2018-07-20 ENCOUNTER — Encounter: Payer: Self-pay | Admitting: Internal Medicine

## 2018-07-20 DIAGNOSIS — Z0289 Encounter for other administrative examinations: Secondary | ICD-10-CM

## 2018-07-20 NOTE — Progress Notes (Deleted)
Subjective:    Patient ID: Ralph White, male    DOB: [birth year], 42 y.o.   MRN: [id number]  HPI  Pt presents to the clinic today for his annual exam. He is also due to follow up HLD.  HLD: His last LDL was 59, 10/2017. He is taking Atorvastatin as prescribed. He denies myalgias. He does not consume a low fat diet.  Flu: 04/2016 Tetanus: 11/2013 Vision Screening: Dentist:  Diet: Exercise:   Review of Systems      No past medical history on file.  Current Outpatient Medications  Medication Sig Dispense Refill  . atorvastatin (LIPITOR) 10 MG tablet TAKE 1 TABLET BY MOUTH EVERY DAY 30 tablet 1   No current facility-administered medications for this visit.     No Known Allergies  Family History  Problem Relation Age of Onset  . Hyperlipidemia Maternal Grandmother   . Diabetes Maternal Grandmother   . Heart disease Maternal Grandfather     Social History   Socioeconomic History  . Marital status: Married    Spouse name: Not on file  . Number of children: Not on file  . Years of education: Not on file  . Highest education level: Not on file  Occupational History  . Not on file  Social Needs  . Financial resource strain: Not on file  . Food insecurity:    Worry: Not on file    Inability: Not on file  . Transportation needs:    Medical: Not on file    Non-medical: Not on file  Tobacco Use  . Smoking status: Never Smoker  . Smokeless tobacco: Current User    Types: Snuff  Substance and Sexual Activity  . Alcohol use: Yes    Comment: social  . Drug use: No  . Sexual activity: Yes  Lifestyle  . Physical activity:    Days per week: Not on file    Minutes per session: Not on file  . Stress: Not on file  Relationships  . Social connections:    Talks on phone: Not on file    Gets together: Not on file    Attends religious service: Not on file    Active member of club or organization: Not on file    Attends meetings of clubs or organizations: Not on  file    Relationship status: Not on file  . Intimate partner violence:    Fear of current or ex partner: Not on file    Emotionally abused: Not on file    Physically abused: Not on file    Forced sexual activity: Not on file  Other Topics Concern  . Not on file  Social History Narrative  . Not on file     Constitutional: Denies fever, malaise, fatigue, headache or abrupt weight changes.  HEENT: Denies eye pain, eye redness, ear pain, ringing in the ears, wax buildup, runny nose, nasal congestion, bloody nose, or sore throat. Respiratory: Denies difficulty breathing, shortness of breath, cough or sputum production.   Cardiovascular: Denies chest pain, chest tightness, palpitations or swelling in the hands or feet.  Gastrointestinal: Denies abdominal pain, bloating, constipation, diarrhea or blood in the stool.  GU: Denies urgency, frequency, pain with urination, burning sensation, blood in urine, odor or discharge. Musculoskeletal: Denies decrease in range of motion, difficulty with gait, muscle pain or joint pain and swelling.  Skin: Denies redness, rashes, lesions or ulcercations.  Neurological: Denies dizziness, difficulty with memory, difficulty with speech or problems with balance and  coordination.  Psych: Denies anxiety, depression, SI/HI.  No other specific complaints in a complete review of systems (except as listed in HPI above).  Objective:   Physical Exam        Assessment & Plan:

## 2018-08-20 IMAGING — CT CT RENAL STONE PROTOCOL
2 of 4 series · 16 of 46 positions shown, 18 images · non-contrast
Comparison: Testicular sonogram 08/26/2017.

CLINICAL DATA: 41-year-old male with left testicular pain since
yesterday. Prior appendectomy. Subsequent encounter.

EXAM:
CT ABDOMEN AND PELVIS WITHOUT CONTRAST
TECHNIQUE: Multidetector CT imaging of the abdomen and pelvis was performed
following the standard protocol without IV contrast.

[Series 2: stone full standard · axial · 0.73mm/px · z∈[-1100,-575]mm · 13 of 115 slices shown, 15 images]
[im 5/115  soft-tissue]
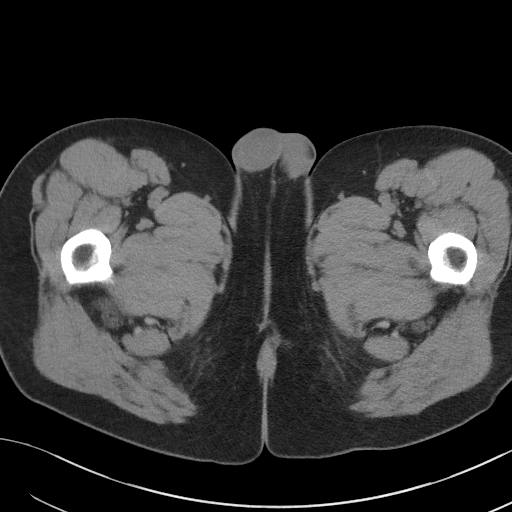
[im 5/115  bone]
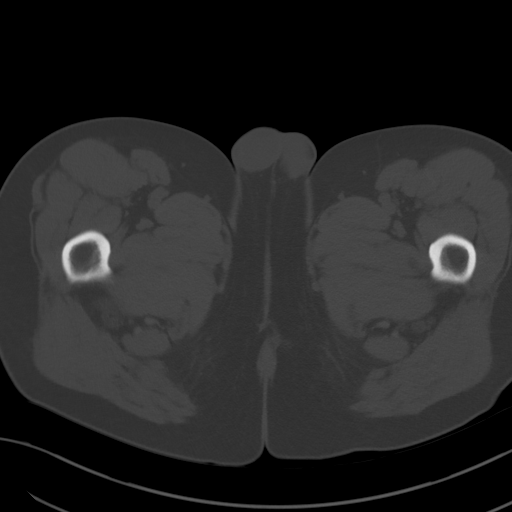
[im 14/115  soft-tissue]
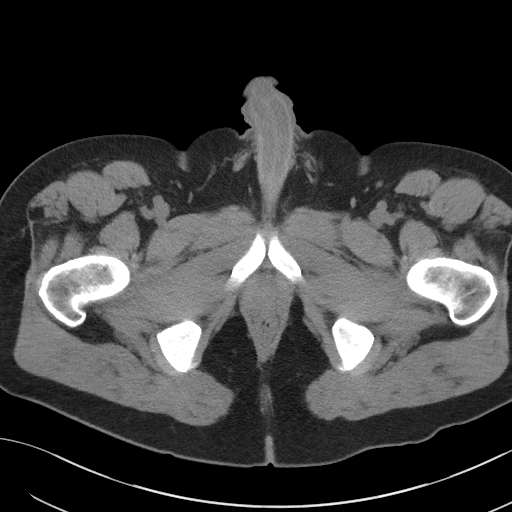
[im 22/115  soft-tissue]
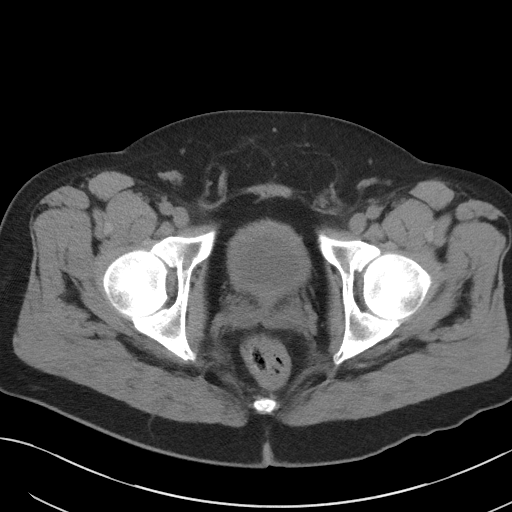
[im 31/115  soft-tissue]
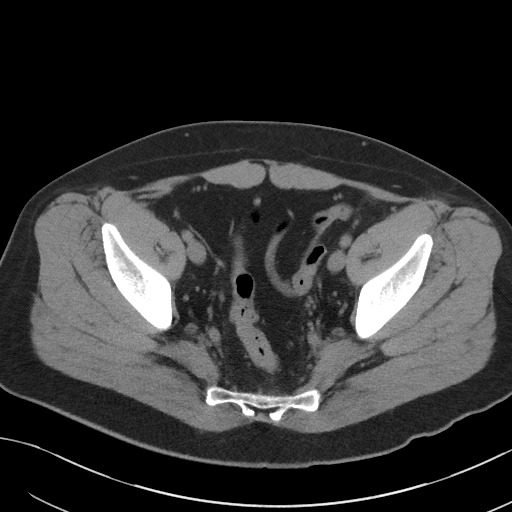
[im 40/115  soft-tissue]
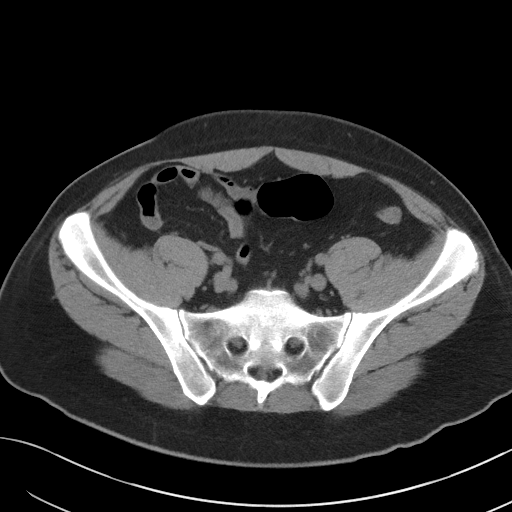
[im 49/115  soft-tissue]
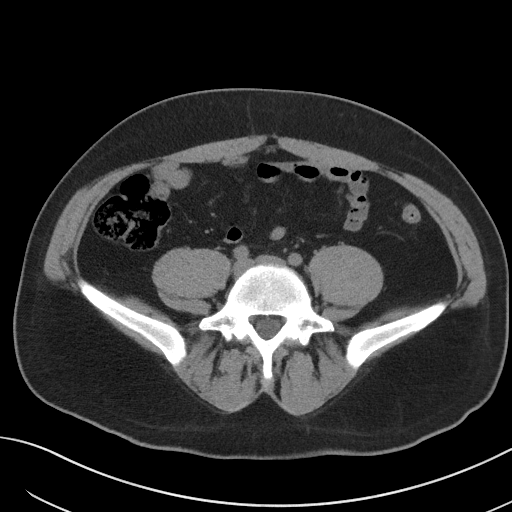
[im 58/115  soft-tissue]
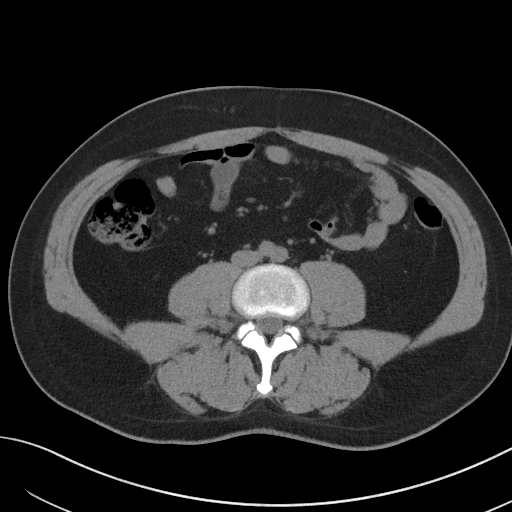
[im 66/115  soft-tissue]
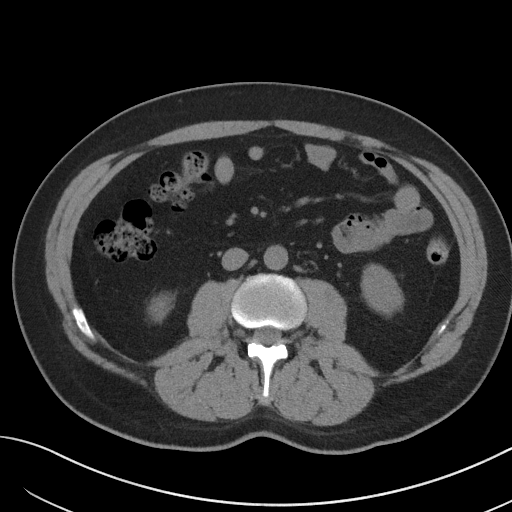
[im 75/115  soft-tissue]
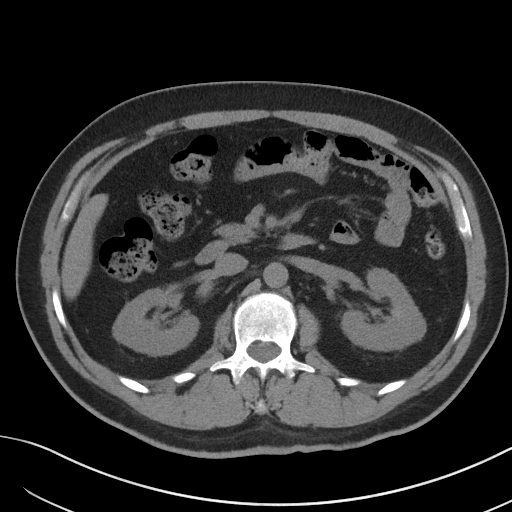
[im 75/115  bone]
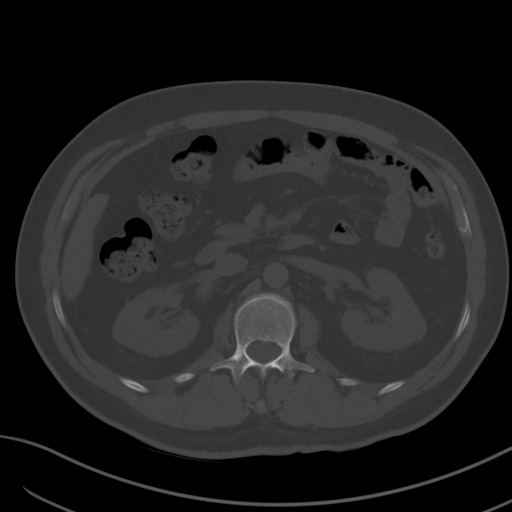
[im 84/115  soft-tissue]
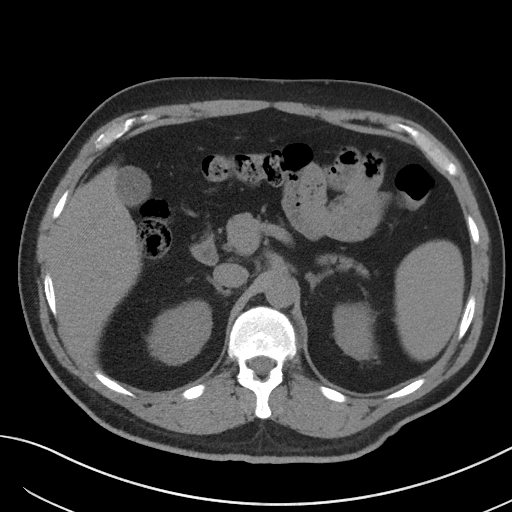
[im 93/115  soft-tissue]
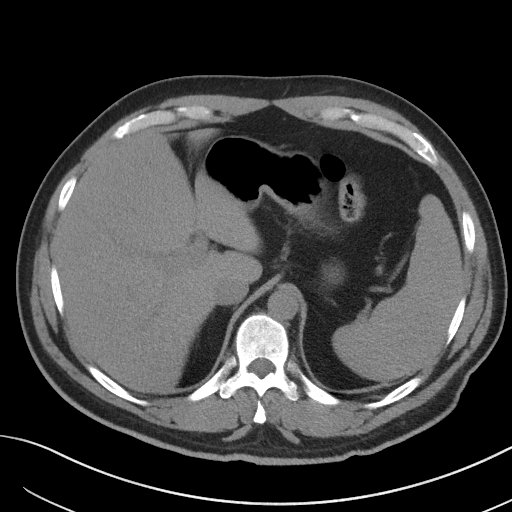
[im 101/115  soft-tissue]
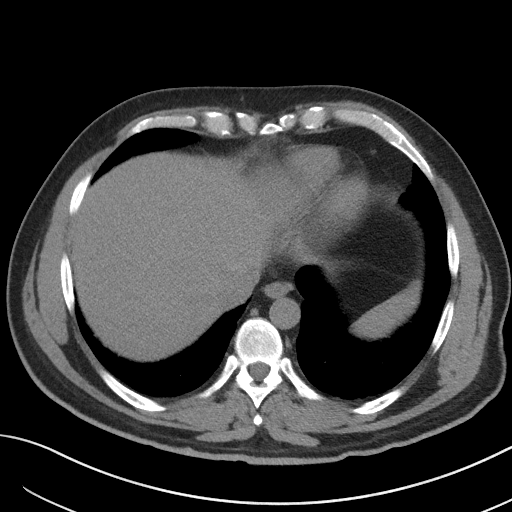
[im 110/115  soft-tissue]
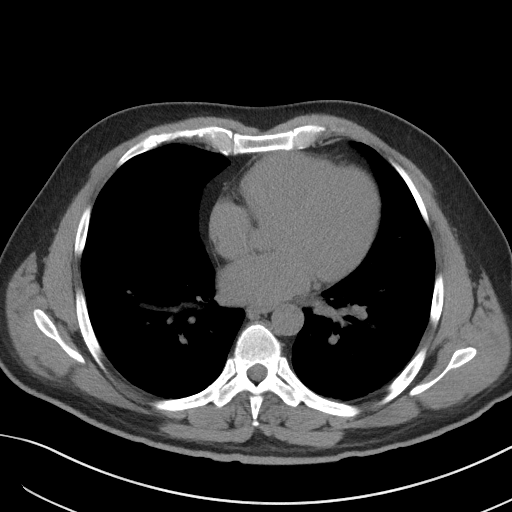

[Series 5: coronal · coronal · 0.83mm/px · 3 of 135 slices shown]
[im 45/135  soft-tissue]
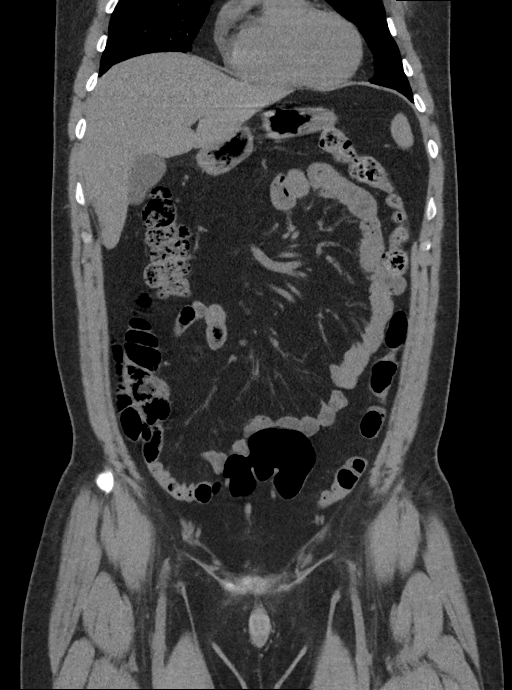
[im 60/135  soft-tissue]
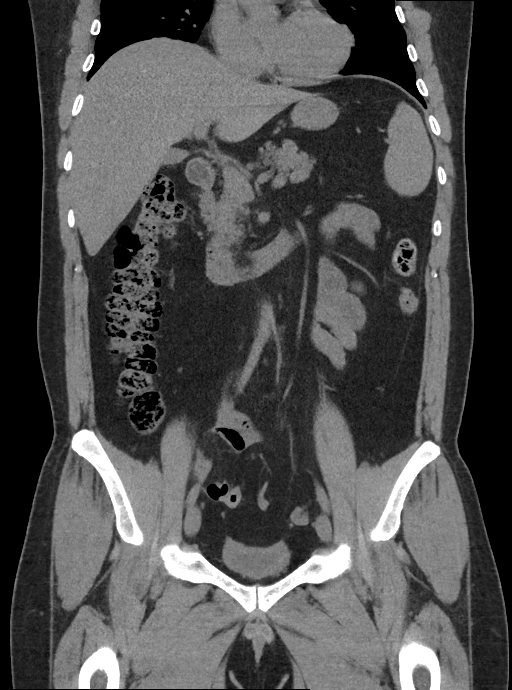
[im 75/135  soft-tissue]
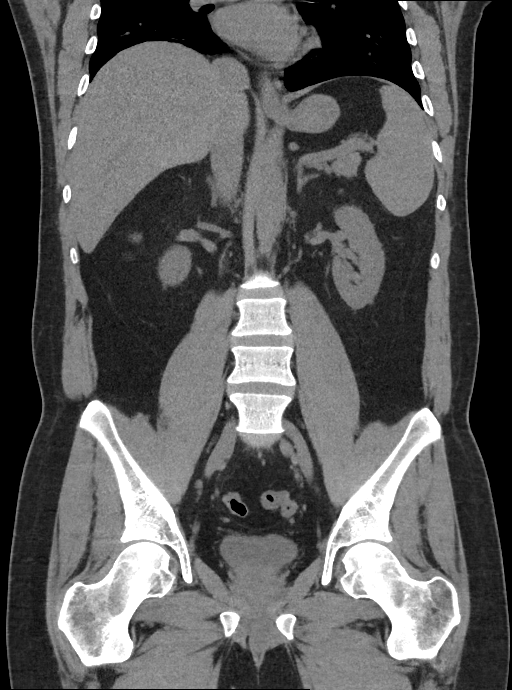

[16 of 46 positions shown; findings below may reference images not displayed]

FINDINGS: Lower chest: Minimal basilar atelectasis/scarring. Heart size within
normal limits.

Hepatobiliary: Taking into account limitation by non contrast
imaging, no worrisome hepatic lesion. No calcified gallstones.

Pancreas: Taking into account limitation by non contrast imaging, no
worrisome pancreatic mass or inflammation.

Spleen: Taking into account limitation by non contrast imaging, no
splenic mass or enlargement.

Adrenals/Urinary Tract: No obstructing stone or hydronephrosis.
Slightly dense renal pyramids probably normal rather than
representing changes of medullary sponge disease.

Taking into account limitation by non contrast imaging, no adrenal
or renal worrisome mass.

Decompressed urinary bladder with circumferential wall thickening.

Stomach/Bowel: No extraluminal bowel inflammatory process or mass
identified.

Vascular/Lymphatic: No abdominal aortic aneurysm. Coarse
calcification internal iliac artery bilaterally.

No adenopathy

Reproductive: No worrisome abnormality

Other: Small fat and vessel containing inguinal hernias. No bowel
containing hernia. No free intraperitoneal air.

Musculoskeletal: Small Schmorl's node deformities lower thoracic
spine. No osseous destructive lesion.
IMPRESSION: No obstructing stone or hydronephrosis.

No bowel inflammatory process.

Small fat and vessel containing inguinal hernias without bowel
containing hernia.

Decompressed urinary bladder with circumferential wall thickening.

Focal calcification internal iliac artery bilaterally. No other
evidence of atherosclerosis.

## 2019-09-15 ENCOUNTER — Telehealth: Payer: Self-pay

## 2019-09-15 NOTE — Telephone Encounter (Signed)
[facility] Night - Client Nonclinical Telephone Record AccessNurse Client [facility] Night - Client Client Site [facility] - Night Physician Nicki Reaper - NP Contact Type Call Who Is Calling Patient / Member / Family / Caregiver Caller Name Jessy Cybulski Caller Phone Number [phone] Patient Name Ralph White Patient DOB 09-30-1975 Call Type Message Only Information Provided Reason for Call Request for General Office Information Initial Comment Caller states they have [facility] and would like to assign the MD as their PCP through their insurance. Additional Comment Caller states if they do not pick up just leave a message and they will call back. Disp. Time Disposition Final User 09/14/2019 5:18:46 PM General Information Provided Yes Donald Siva Call Closed By: Donald Siva Transaction Date/Time: 09/14/2019 5:16:00 PM (ET)

## 2020-08-03 ENCOUNTER — Other Ambulatory Visit: Payer: Self-pay

## 2020-08-03 DIAGNOSIS — Z20822 Contact with and (suspected) exposure to covid-19: Secondary | ICD-10-CM

## 2020-08-06 LAB — NOVEL CORONAVIRUS, NAA: SARS-CoV-2, NAA: NOT DETECTED

## 2023-08-18 ENCOUNTER — Emergency Department: Payer: Self-pay

## 2023-08-18 ENCOUNTER — Other Ambulatory Visit: Payer: Self-pay

## 2023-08-18 ENCOUNTER — Emergency Department
Admission: EM | Admit: 2023-08-18 | Discharge: 2023-08-18 | Disposition: A | Discharge status: Home / Self Care | Payer: Self-pay | Attending: Emergency Medicine | Admitting: Emergency Medicine

## 2023-08-18 DIAGNOSIS — D1602 Benign neoplasm of scapula and long bones of left upper limb: Secondary | ICD-10-CM

## 2023-08-18 DIAGNOSIS — M25512 Pain in left shoulder: Secondary | ICD-10-CM

## 2023-08-18 DIAGNOSIS — D1612 Benign neoplasm of short bones of left upper limb: Secondary | ICD-10-CM | POA: Insufficient documentation

## 2023-08-18 MED ORDER — FENTANYL CITRATE PF 50 MCG/ML IJ SOSY
50.0000 ug | PREFILLED_SYRINGE | INTRAMUSCULAR | Status: DC | PRN
  Administered 2023-08-18: 50 ug via NASAL
  Filled 2023-08-18: qty 1

## 2023-08-18 MED ORDER — IBUPROFEN 600 MG PO TABS
600.0000 mg | ORAL_TABLET | Freq: Three times a day (TID) | ORAL | 0 refills | Status: AC | PRN
Start: 2023-08-18 — End: ?

## 2023-08-18 MED ORDER — METHOCARBAMOL 500 MG PO TABS
1000.0000 mg | ORAL_TABLET | Freq: Once | ORAL | Status: AC
  Administered 2023-08-18: 1000 mg via ORAL
  Filled 2023-08-18: qty 2

## 2023-08-18 MED ORDER — METHOCARBAMOL 500 MG PO TABS: 500.0000 mg | ORAL_TABLET | Freq: Four times a day (QID) | ORAL | 0 refills | Status: AC | PRN

## 2023-08-18 NOTE — ED Triage Notes (Signed)
Pt presents via POV c/o left sided shoulder pain radiating into left side of neck. Denies injury. Reports went to bed last night without pain. Reports emesis x3 this am. Reports woke up from sleep PTA with severe pain. Reports took Tylenol x3 at home without relief.

## 2023-08-18 NOTE — Discharge Instructions (Signed)
Call make an appoint with Dr. Joice Lofts who is on-call for orthopedics if any continued problems with your shoulder and also for follow-up of a abnormality to the bone of your left shoulder.  Begin taking ibuprofen 600 mg every 8 hours with food and methocarbamol every 6 hours as needed for muscle spasms.  Use warm moist compresses to the muscle as needed for discomfort.  Do not drive or operate machinery while taking the muscle relaxant as it could cause drowsiness.  We also need to locate a primary care provider and start with the clinic in [facility] to see if they are taking new patients.

## 2023-08-18 NOTE — ED Provider Notes (Signed)
[facility] Provider Note    None    (approximate)   History   Shoulder Pain   HPI  Ralph White is a 48 y.o. male   presents to the ED with complaint of left shoulder pain.  Patient states that he began at approximately 4 AM this morning.  He reports that he sleeps on his stomach and has his arms extended above his head.  He denies any recent injuries or previous shoulder problems.  Prior to arrival he had 3 Tylenol and used some Voltaren gel.      Physical Exam   Triage Vital Signs: ED Triage Vitals  Encounter Vitals Group     BP 08/18/23 0554 (!) 141/77     Systolic BP Percentile --      Diastolic BP Percentile --      Pulse Rate 08/18/23 0551 77     Resp 08/18/23 0551 18     Temp 08/18/23 0551 98.1 F (36.7 C)     Temp Source 08/18/23 0551 Oral     SpO2 08/18/23 0551 100 %     Weight --      Height --      Head Circumference --      Peak Flow --      Pain Score 08/18/23 0552 10     Pain Loc --      Pain Education --      Exclude from Growth Chart --     Most recent vital signs: Vitals:   08/18/23 0551 08/18/23 0554  BP:  (!) 141/77  Pulse: 77   Resp: 18   Temp: 98.1 F (36.7 C)   SpO2: 100%      General: Awake, no distress.  CV:  Good peripheral perfusion.  Heart rate rate and rhythm. Resp:  Normal effort.  Lungs clear bilaterally. Abd:  No distention.  Other:  Nontender cervical spine to palpation posteriorly.  There is tenderness on palpation of the right trapezius muscle but no point tenderness is noted to the right shoulder or A/C joint area.  Left trapezius muscle also is moderately tender to palpation.  No crepitus or point tenderness noted to the left shoulder.  Palpation of the proximal humerus is negative for tenderness or pain.  Skin is intact.  Pulses present bilaterally.   ED Results / Procedures / Treatments   Labs (all labs ordered are listed, but only abnormal results are displayed) Labs Reviewed - No  data to display    RADIOLOGY Left shoulder x-ray images were reviewed and interpreted by myself independent the radiologist and no fracture or dislocation was noted.  Radiology reports a small 1.5 cm osteochondroma that was noted on x-ray view.    PROCEDURES:  Critical Care performed:   Procedures   MEDICATIONS ORDERED IN ED: Medications  fentaNYL (SUBLIMAZE) injection 50 mcg (50 mcg Nasal Given 08/18/23 0557)  methocarbamol (ROBAXIN) tablet 1,000 mg (1,000 mg Oral Given 08/18/23 2956)     IMPRESSION / MDM / ASSESSMENT AND PLAN / ED COURSE  I reviewed the triage vital signs and the nursing notes.   Differential diagnosis includes, but is not limited to, acute left shoulder pain/musculoskeletal strain, osteoarthritis, tendinitis, bursitis.  48 year old male presents to the ED with complaint of left shoulder pain that started approximately 4 AM this morning without history of injury.  Patient did not get any relief with Tylenol and Voltaren gel.  X-ray does not show any acute changes however radiology did  mention a osteochondroma approximately 1.5 cm.  Patient was nontender on palpation of this area.  I discussed with him the need for follow-up with Dr. Joice Lofts at [facility] clinic orthopedic department.  Patient was given methocarbamol while in the ED and was able to have increased range of motion.  He is encouraged to use moist heat to the area and continue with the methocarbamol and ibuprofen.  He is to follow-up with his PCP.      Patient's presentation is most consistent with acute complicated illness / injury requiring diagnostic workup.  FINAL CLINICAL IMPRESSION(S) / ED DIAGNOSES   Final diagnoses:  Acute pain of left shoulder  Osteochondroma of left humerus     Rx / DC Orders   ED Discharge Orders          Ordered    methocarbamol (ROBAXIN) 500 MG tablet  Every 6 hours PRN        08/18/23 0854    ibuprofen (ADVIL) 600 MG tablet  Every 8 hours PRN        08/18/23  0854             Note:  This document was prepared using Dragon voice recognition software and may include unintentional dictation errors.   Tommi Rumps, PA-C 08/18/23 1517    Phineas Semen, MD 08/19/23 [phone]
